# Patient Record
Sex: Female | Born: 1995 | Race: White | Hispanic: No | Marital: Single | Smoking: Never smoker
Health system: Southern US, Community
[De-identification: ages and names within clinical notes are randomized; demographics above are authoritative.]

## PROBLEM LIST (undated history)

## (undated) DIAGNOSIS — F419 Anxiety disorder, unspecified: Secondary | ICD-10-CM

## (undated) HISTORY — PX: NO PAST SURGERIES: SHX2092

---

## 2019-04-25 ENCOUNTER — Other Ambulatory Visit: Payer: Self-pay

## 2019-04-25 DIAGNOSIS — Z20822 Contact with and (suspected) exposure to covid-19: Secondary | ICD-10-CM

## 2019-04-26 LAB — NOVEL CORONAVIRUS, NAA: SARS-CoV-2, NAA: NOT DETECTED

## 2019-11-19 ENCOUNTER — Ambulatory Visit: Payer: Self-pay | Attending: Family

## 2019-11-19 DIAGNOSIS — Z23 Encounter for immunization: Secondary | ICD-10-CM

## 2019-11-19 NOTE — Progress Notes (Signed)
   Covid-19 Vaccination Clinic  Name:  Kathryn Williamson    MRN: [id number] DOB: 1995/11/21  11/19/2019  [name] was observed post Covid-19 immunization for 15 minutes without incident. She was provided with Vaccine Information Sheet and instruction to access the V-Safe system.   [name] was instructed to call 911 with any severe reactions post vaccine: Marland Kitchen Difficulty breathing  . Swelling of face and throat  . A fast heartbeat  . A bad rash all over body  . Dizziness and weakness   Immunizations Administered    Name Date Dose VIS Date Route   Moderna COVID-19 Vaccine 11/19/2019  2:19 PM 0.5 mL 08/04/2019 Intramuscular   Manufacturer: Moderna   Lot: [id number]   NDC: 39584-417-12

## 2019-12-22 ENCOUNTER — Ambulatory Visit: Payer: Self-pay | Attending: Family

## 2019-12-22 DIAGNOSIS — Z23 Encounter for immunization: Secondary | ICD-10-CM

## 2019-12-22 NOTE — Progress Notes (Signed)
   Covid-19 Vaccination Clinic  Name:  Brienna Bass    MRN: [id number] DOB: October 09, 1995  12/22/2019  Ms. Pete was observed post Covid-19 immunization for 15 minutes without incident. She was provided with Vaccine Information Sheet and instruction to access the V-Safe system.   Ms. Shrake was instructed to call 911 with any severe reactions post vaccine: Marland Kitchen Difficulty breathing  . Swelling of face and throat  . A fast heartbeat  . A bad rash all over body  . Dizziness and weakness   Immunizations Administered    Name Date Dose VIS Date Route   Moderna COVID-19 Vaccine 12/22/2019 12:20 PM 0.5 mL 08/2019 Intramuscular   Manufacturer: Moderna   Lot: [id number]   NDC: 03403-524-81

## 2020-07-09 ENCOUNTER — Encounter (HOSPITAL_COMMUNITY): Payer: Self-pay | Admitting: *Deleted

## 2020-07-09 ENCOUNTER — Other Ambulatory Visit: Payer: Self-pay

## 2020-07-09 ENCOUNTER — Ambulatory Visit (HOSPITAL_COMMUNITY)
Admission: EM | Admit: 2020-07-09 | Discharge: 2020-07-09 | Disposition: A | Discharge status: Home / Self Care | Payer: BC Managed Care – PPO | Attending: Family Medicine | Admitting: Family Medicine

## 2020-07-09 DIAGNOSIS — N898 Other specified noninflammatory disorders of vagina: Secondary | ICD-10-CM | POA: Diagnosis not present

## 2020-07-09 DIAGNOSIS — N760 Acute vaginitis: Secondary | ICD-10-CM | POA: Insufficient documentation

## 2020-07-09 LAB — POCT URINALYSIS DIPSTICK, ED / UC
Bilirubin Urine: NEGATIVE
Glucose, UA: NEGATIVE mg/dL
Hgb urine dipstick: NEGATIVE
Ketones, ur: NEGATIVE mg/dL
Leukocytes,Ua: NEGATIVE
Nitrite: NEGATIVE
Protein, ur: NEGATIVE mg/dL
Specific Gravity, Urine: 1.025 (ref 1.005–1.030)
Urobilinogen, UA: 1 mg/dL (ref 0.0–1.0)
pH: 6.5 (ref 5.0–8.0)

## 2020-07-09 MED ORDER — FLUCONAZOLE 150 MG PO TABS: 150.0000 mg | ORAL_TABLET | ORAL | 0 refills | Status: DC

## 2020-07-09 NOTE — ED Triage Notes (Signed)
PT reports frequent yeast infections and reports a white Vag. DC now. Pt has been using OTC meds with out relief. Pt request PO med for yeast be written for 2 doses.

## 2020-07-09 NOTE — ED Provider Notes (Signed)
[facility]    CSN: [id number] Arrival date & time: 07/09/20  1700      History   Chief Complaint Chief Complaint  Patient presents with  . Vaginal Discharge    HPI Kathryn Williamson is a 24 y.o. female.   Patient presenting today with several days of white vaginal discharge and vaginal irritation. Denies dysuria, hematuria, abdominal or pelvic pain, concern for STIs, fever, rashes. Just came off her menstrual cycle several days ago as sxs came on. Trying monistat cream without benefit.      History reviewed. No pertinent past medical history.  There are no problems to display for this patient.   History reviewed. No pertinent surgical history.  OB History   No obstetric history on file.      Home Medications    Prior to Admission medications   Medication Sig Start Date End Date Taking? Authorizing Provider  fluconazole (DIFLUCAN) 150 MG tablet Take 1 tablet (150 mg total) by mouth once a week. 07/09/20   Particia Nearing, PA-C    Family History History reviewed. No pertinent family history.  Social History Social History   Tobacco Use  . Smoking status: Never Smoker  . Smokeless tobacco: Never Used  Substance Use Topics  . Alcohol use: Never  . Drug use: Never     Allergies   Patient has no known allergies.   Review of Systems Review of Systems PER HPI   Physical Exam Triage Vital Signs ED Triage Vitals  Enc Vitals Group     BP 07/09/20 1721 (!) 97/57     Pulse Rate 07/09/20 1721 77     Resp 07/09/20 1721 15     Temp 07/09/20 1721 97.7 F (36.5 C)     Temp Source 07/09/20 1721 Oral     SpO2 07/09/20 1721 99 %     Weight 07/09/20 1724 115 lb (52.2 kg)     Height 07/09/20 1724 5\' 2"  (1.575 m)     Head Circumference --      Peak Flow --      Pain Score 07/09/20 1723 9     Pain Loc --      Pain Edu? --      Excl. in GC? --    No data found.  Updated Vital Signs BP (!) 97/57 (BP Location: Right Arm)   Pulse 77   Temp  97.7 F (36.5 C) (Oral)   Resp 15   Ht 5\' 2"  (1.575 m)   Wt 115 lb (52.2 kg)   LMP 07/06/2020   SpO2 99%   BMI 21.03 kg/m   Visual Acuity Right Eye Distance:   Left Eye Distance:   Bilateral Distance:    Right Eye Near:   Left Eye Near:    Bilateral Near:     Physical Exam Vitals and nursing note reviewed.  Constitutional:      Appearance: Normal appearance. She is not ill-appearing.  HENT:     Head: Atraumatic.  Eyes:     Extraocular Movements: Extraocular movements intact.     Conjunctiva/sclera: Conjunctivae normal.  Cardiovascular:     Rate and Rhythm: Normal rate and regular rhythm.     Heart sounds: Normal heart sounds.  Pulmonary:     Effort: Pulmonary effort is normal.     Breath sounds: Normal breath sounds.  Abdominal:     General: Bowel sounds are normal. There is no distension.     Palpations: Abdomen is soft.  Tenderness: There is no abdominal tenderness. There is no right CVA tenderness, left CVA tenderness or guarding.  Genitourinary:    Comments: GU exam deferred, self swab performed Musculoskeletal:        General: Normal range of motion.     Cervical back: Normal range of motion and neck supple.  Skin:    General: Skin is warm and dry.  Neurological:     Mental Status: She is alert and oriented to person, place, and time.  Psychiatric:        Mood and Affect: Mood normal.        Thought Content: Thought content normal.        Judgment: Judgment normal.    UC Treatments / Results  Labs (all labs ordered are listed, but only abnormal results are displayed) Labs Reviewed  POCT URINALYSIS DIPSTICK, ED / UC  CERVICOVAGINAL ANCILLARY ONLY    EKG   Radiology No results found.  Procedures Procedures (including critical care time)  Medications Ordered in UC Medications - No data to display  Initial Impression / Assessment and Plan / UC Course  I have reviewed the triage vital signs and the nursing notes.  Pertinent labs & imaging  results that were available during my care of the patient were reviewed by me and considered in my medical decision making (see chart for details).     U/A benign, aptima swab pending. Will tx with diflucan while awaiting results given her hx of recurrent yeast infections. Discussed probiotics, good vaginal hygiene. F/u if sxs worsening in meantime.   Final Clinical Impressions(s) / UC Diagnoses   Final diagnoses:  Acute vaginitis   Discharge Instructions   None    ED Prescriptions    Medication Sig Dispense Auth. Provider   fluconazole (DIFLUCAN) 150 MG tablet Take 1 tablet (150 mg total) by mouth once a week. 2 tablet Particia Nearing, [place]     PDMP not reviewed this encounter.   Particia Nearing, [place] 07/09/20 1828

## 2020-07-11 LAB — CERVICOVAGINAL ANCILLARY ONLY
Bacterial Vaginitis (gardnerella): NEGATIVE
Candida Glabrata: NEGATIVE
Candida Vaginitis: NEGATIVE
Comment: NEGATIVE
Comment: NEGATIVE
Comment: NEGATIVE

## 2020-07-25 ENCOUNTER — Encounter (HOSPITAL_COMMUNITY): Payer: Self-pay

## 2020-07-25 ENCOUNTER — Other Ambulatory Visit: Payer: Self-pay

## 2020-07-25 ENCOUNTER — Ambulatory Visit (HOSPITAL_COMMUNITY)
Admission: EM | Admit: 2020-07-25 | Discharge: 2020-07-25 | Disposition: A | Discharge status: Still In Hospital | Payer: BC Managed Care – PPO

## 2020-07-25 ENCOUNTER — Ambulatory Visit (HOSPITAL_COMMUNITY)
Admission: EM | Admit: 2020-07-25 | Discharge: 2020-07-25 | Disposition: A | Discharge status: Home / Self Care | Payer: BC Managed Care – PPO | Attending: Urgent Care | Admitting: Urgent Care

## 2020-07-25 ENCOUNTER — Ambulatory Visit (HOSPITAL_COMMUNITY)
Admission: EM | Admit: 2020-07-25 | Discharge: 2020-07-25 | Disposition: A | Discharge status: Home / Self Care | Payer: BC Managed Care – PPO

## 2020-07-25 DIAGNOSIS — Z3202 Encounter for pregnancy test, result negative: Secondary | ICD-10-CM | POA: Diagnosis not present

## 2020-07-25 DIAGNOSIS — R112 Nausea with vomiting, unspecified: Secondary | ICD-10-CM | POA: Diagnosis not present

## 2020-07-25 DIAGNOSIS — F1290 Cannabis use, unspecified, uncomplicated: Secondary | ICD-10-CM | POA: Insufficient documentation

## 2020-07-25 DIAGNOSIS — F1210 Cannabis abuse, uncomplicated: Secondary | ICD-10-CM | POA: Diagnosis present

## 2020-07-25 LAB — BASIC METABOLIC PANEL
Anion gap: 13 (ref 5–15)
BUN: 10 mg/dL (ref 6–20)
CO2: 21 mmol/L — ABNORMAL LOW (ref 22–32)
Calcium: 9.5 mg/dL (ref 8.9–10.3)
Chloride: 104 mmol/L (ref 98–111)
Creatinine, Ser: 0.77 mg/dL (ref 0.44–1.00)
GFR, Estimated: >60 mL/min (ref >60)
Glucose, Bld: 105 mg/dL — ABNORMAL HIGH (ref 70–99)
Potassium: 3.6 mmol/L (ref 3.5–5.1)
Sodium: 138 mmol/L (ref 135–145)

## 2020-07-25 LAB — CBC
HCT: 38.4 % (ref 36.0–46.0)
Hemoglobin: 13.1 g/dL (ref 12.0–15.0)
MCH: 31.9 pg (ref 26.0–34.0)
MCHC: 34.1 g/dL (ref 30.0–36.0)
MCV: 93.4 fL (ref 80.0–100.0)
Platelets: 221 10*3/uL (ref 150–400)
RBC: 4.11 MIL/uL (ref 3.87–5.11)
RDW: 12.3 % (ref 11.5–15.5)
WBC: 7.6 10*3/uL (ref 4.0–10.5)
nRBC: 0 % (ref 0.0–0.2)

## 2020-07-25 LAB — POC URINE PREG, ED: Preg Test, Ur: NEGATIVE

## 2020-07-25 MED ORDER — ONDANSETRON HCL 4 MG/2ML IJ SOLN
INTRAMUSCULAR | Status: AC
  Filled 2020-07-25: qty 2

## 2020-07-25 MED ORDER — ONDANSETRON 8 MG PO TBDP: 8.0000 mg | ORAL_TABLET | Freq: Three times a day (TID) | ORAL | 0 refills | Status: DC | PRN

## 2020-07-25 MED ORDER — SODIUM CHLORIDE 0.9 % IV BOLUS
1000.0000 mL | Freq: Once | INTRAVENOUS | Status: AC
  Administered 2020-07-25: 1000 mL via INTRAVENOUS

## 2020-07-25 MED ORDER — ONDANSETRON HCL 4 MG/2ML IJ SOLN
4.0000 mg | Freq: Once | INTRAMUSCULAR | Status: AC
  Administered 2020-07-25: 4 mg via INTRAVENOUS

## 2020-07-25 NOTE — ED Notes (Signed)
Spoke to patient about symptoms, vomiting, anxiety.  Spoke to dr Delton See.  Patient sent to ed for further evaluation

## 2020-07-25 NOTE — ED Provider Notes (Signed)
[facility]   MRN: [id number] DOB: October 25, 1995  Subjective:   Kathryn Williamson is a 24 y.o. female presenting for 4-day history of acute onset worsening nausea, vomiting and fatigue.  Patient has started feeling like she is passing out in the past 1 to 2 days.  Had intractable vomiting in the past 2 days.  Admits that she has been smoking marijuana excessively for the past month multiple times per day.  Denies fever, active confusion, headache, chest pain, shortness of breath.  She has had some lower abdominal cramping intermittently the past couple of days.  Pain is moderate and last a few hours.  LMP was 1 week ago.  Denies history of GI issues, diarrhea, constipation, bloody stools.  Denies urinary symptoms.  Denies taking chronic medications.    No Known Allergies  History reviewed. No pertinent past medical history.   History reviewed. No pertinent surgical history.  Family History  Problem Relation Age of Onset  . Healthy Mother   . Healthy Father     Social History   Tobacco Use  . Smoking status: Never Smoker  . Smokeless tobacco: Never Used  Substance Use Topics  . Alcohol use: Never  . Drug use: Never    ROS   Objective:   Vitals: BP (!) 86/63 (BP Location: Left Arm)   Pulse 65   Temp 97.8 F (36.6 C) (Oral)   Resp 19   LMP 07/06/2020   SpO2 99%   Physical Exam Constitutional:      General: She is not in acute distress.    Appearance: Normal appearance. She is well-developed and normal weight. She is not ill-appearing, toxic-appearing or diaphoretic.  HENT:     Head: Normocephalic and atraumatic.     Right Ear: External ear normal.     Left Ear: External ear normal.     Nose: Nose normal.     Mouth/Throat:     Mouth: Mucous membranes are dry.     Pharynx: Oropharynx is clear.  Eyes:     General: No scleral icterus.    Extraocular Movements: Extraocular movements intact.     Pupils: Pupils are equal, round, and reactive to light.    Cardiovascular:     Rate and Rhythm: Normal rate and regular rhythm.     Pulses: Normal pulses.     Heart sounds: Normal heart sounds. No murmur heard.  No friction rub. No gallop.   Pulmonary:     Effort: Pulmonary effort is normal. No respiratory distress.     Breath sounds: Normal breath sounds. No stridor. No wheezing, rhonchi or rales.  Abdominal:     General: Bowel sounds are normal. There is no distension.     Palpations: Abdomen is soft. There is no mass.     Tenderness: There is generalized abdominal tenderness (mild) and tenderness in the right lower quadrant, periumbilical area, suprapubic area and left lower quadrant. There is no right CVA tenderness, left CVA tenderness, guarding or rebound.  Skin:    General: Skin is warm and dry.     Coloration: Skin is not pale.     Findings: No rash.  Neurological:     General: No focal deficit present.     Mental Status: She is alert and oriented to person, place, and time.  Psychiatric:        Mood and Affect: Mood normal.        Behavior: Behavior normal.  Thought Content: Thought content normal.        Judgment: Judgment normal.     Results for orders placed or performed during the hospital encounter of 07/25/20 (from the past 24 hour(s))  CBC     Status: None   Collection Time: 07/25/20  9:00 AM  Result Value Ref Range   WBC 7.6 4.0 - 10.5 K/uL   RBC 4.11 3.87 - 5.11 MIL/uL   Hemoglobin 13.1 12.0 - 15.0 g/dL   HCT 09.3 36 - 46 %   MCV 93.4 80.0 - 100.0 fL   MCH 31.9 26.0 - 34.0 pg   MCHC 34.1 30.0 - 36.0 g/dL   RDW 26.7 12.4 - 58.0 %   Platelets 221 150 - 400 K/uL   nRBC 0.0 0.0 - 0.2 %  Basic metabolic panel     Status: Abnormal   Collection Time: 07/25/20  9:00 AM  Result Value Ref Range   Sodium 138 135 - 145 mmol/L   Potassium 3.6 3.5 - 5.1 mmol/L   Chloride 104 98 - 111 mmol/L   CO2 21 (L) 22 - 32 mmol/L   Glucose, Bld 105 (H) 70 - 99 mg/dL   BUN 10 6 - 20 mg/dL   Creatinine, Ser 9.98 0.44 - 1.00  mg/dL   Calcium 9.5 8.9 - 33.8 mg/dL   GFR, Estimated >25 >05 mL/min   Anion gap 13 5 - 15  POC urine pregnancy     Status: None   Collection Time: 07/25/20  9:18 AM  Result Value Ref Range   Preg Test, Ur NEGATIVE NEGATIVE    Assessment and Plan :   PDMP not reviewed this encounter.  1. Nausea and vomiting, intractability of vomiting not specified, unspecified vomiting type   2. Marijuana abuse   3. Cannabinoid hyperemesis syndrome     1L IV fluids given with IV Zofran. Labs confirm no AKI, severe anemia. Will manage supportively for CHS. Emphasized need to cut back or quit smoking marijuana. Counseled patient on potential for adverse effects with medications prescribed/recommended today, ER and return-to-clinic precautions discussed, patient verbalized understanding.    Wallis Bamberg, PA-C 07/25/20 1026

## 2020-07-25 NOTE — ED Triage Notes (Signed)
Pt in with c/o N/V, fatigue that started 4 days ago. States that she was I the bathroom vomiting and thinks that she passed out.  States that she has been smoking more marijuana than she normally.  States that she also ate an omelet that was kind of raw and accidentally drank milk that was spoiled.  Denies head injury

## 2020-07-25 NOTE — ED Notes (Signed)
Patient is being discharged from the Urgent Care and sent to the Emergency Department via private vehicle . Per dr Delton See, patient is in need of higher level of care due to failed outpatient treatment plan. Patient is aware and verbalizes understanding of plan of care. There were no vitals filed for this visit.

## 2021-07-03 ENCOUNTER — Ambulatory Visit (HOSPITAL_COMMUNITY)
Admission: EM | Admit: 2021-07-03 | Discharge: 2021-07-03 | Disposition: A | Discharge status: Home / Self Care | Payer: BC Managed Care – PPO | Attending: Emergency Medicine | Admitting: Emergency Medicine

## 2021-07-03 ENCOUNTER — Other Ambulatory Visit: Payer: Self-pay

## 2021-07-03 ENCOUNTER — Encounter (HOSPITAL_COMMUNITY): Payer: Self-pay | Admitting: *Deleted

## 2021-07-03 DIAGNOSIS — R112 Nausea with vomiting, unspecified: Secondary | ICD-10-CM

## 2021-07-03 MED ORDER — ONDANSETRON 8 MG PO TBDP: 8.0000 mg | ORAL_TABLET | Freq: Three times a day (TID) | ORAL | 0 refills | Status: AC | PRN

## 2021-07-03 MED ORDER — ONDANSETRON 4 MG PO TBDP
4.0000 mg | ORAL_TABLET | Freq: Once | ORAL | Status: AC
  Administered 2021-07-03: 4 mg via ORAL

## 2021-07-03 MED ORDER — ONDANSETRON HCL 4 MG/2ML IJ SOLN
4.0000 mg | Freq: Once | INTRAMUSCULAR | Status: AC
  Administered 2021-07-03: 4 mg via INTRAMUSCULAR

## 2021-07-03 MED ORDER — ONDANSETRON 4 MG PO TBDP
ORAL_TABLET | ORAL | Status: AC
  Filled 2021-07-03: qty 1

## 2021-07-03 MED ORDER — ONDANSETRON HCL 4 MG/2ML IJ SOLN
INTRAMUSCULAR | Status: AC
  Filled 2021-07-03: qty 2

## 2021-07-03 NOTE — ED Triage Notes (Signed)
Pt reports vomiting and ABD pain at umbilicus since this morning.

## 2021-07-03 NOTE — Discharge Instructions (Addendum)
Symptoms are most likely related to your marijuana use based on your history, if you feel it is related to your anxiety please follow-up with your primary care doctor, please refrain from further marijuana use  Your symptoms today are most likely related to dehydration and you need to rehydrate as much as possible, I have given you oral medicine and an injection to help with this  Please hydrate with water and electrolyte replacement substances such as liquid IV, Gatorade, Pedialyte or similar products  If once home you are still unable to keep fluids down please go to the nearest emergency department for intravenous therapy  May use Zofran underneath the tongue every 8 hours as needed to help prevent vomiting

## 2021-07-04 NOTE — ED Provider Notes (Signed)
[facility]    CSN: [id number] Arrival date & time: 07/03/21  1813      History   Chief Complaint Chief Complaint  Patient presents with   Abdominal Pain   Emesis    HPI Kathryn Williamson is a 25 y.o. female.   Patient presents with persistent vomiting and centralized abdominal pain for 1 day.  Has not been able to tolerate food or liquids.  Endorses lightheadedness and tingling in her extremities.  Has attempted use of promethazine but was unable to keep pill down.  Endorses similar event happened a few years ago after use of marijuana.  Patient endorses that for the last week she has smoked 4-5 blunts a day and stopped 2 days ago when she started to feel unwell.  History of anxiety.  Denies diarrhea or constipation, fever or chills, dietary changes, recent travel, urinary changes or vaginal changes.  History reviewed. No pertinent past medical history.  There are no problems to display for this patient.   History reviewed. No pertinent surgical history.  OB History   No obstetric history on file.      Home Medications    Prior to Admission medications   Medication Sig Start Date End Date Taking? Authorizing Provider  fluconazole (DIFLUCAN) 150 MG tablet Take 1 tablet (150 mg total) by mouth once a week. 07/09/20   Particia Nearing, PA-C  ondansetron (ZOFRAN-ODT) 8 MG disintegrating tablet Take 1 tablet (8 mg total) by mouth every 8 (eight) hours as needed for nausea or vomiting. 07/03/21   Valinda Hoar, NP    Family History Family History  Problem Relation Age of Onset   Healthy Mother    Healthy Father     Social History Social History   Tobacco Use   Smoking status: Never   Smokeless tobacco: Never  Substance Use Topics   Alcohol use: Never   Drug use: Never     Allergies   Patient has no known allergies.   Review of Systems Review of Systems  Constitutional: Negative.   Respiratory: Negative.    Cardiovascular: Negative.    Gastrointestinal:  Positive for abdominal pain and vomiting. Negative for abdominal distention, anal bleeding, blood in stool, constipation, diarrhea, nausea and rectal pain.  Skin: Negative.   Neurological: Negative.     Physical Exam Triage Vital Signs ED Triage Vitals  Enc Vitals Group     BP 07/03/21 1916 123/84     Pulse Rate 07/03/21 1916 (!) 58     Resp 07/03/21 1916 20     Temp 07/03/21 1916 98.2 F (36.8 C)     Temp src --      SpO2 07/03/21 1916 98 %     Weight --      Height --      Head Circumference --      Peak Flow --      Pain Score 07/03/21 1913 9     Pain Loc --      Pain Edu? --      Excl. in GC? --    No data found.  Updated Vital Signs BP 123/84   Pulse (!) 58   Temp 98.2 F (36.8 C)   Resp 20   LMP 06/26/2021   SpO2 98%   Visual Acuity Right Eye Distance:   Left Eye Distance:   Bilateral Distance:    Right Eye Near:   Left Eye Near:    Bilateral Near:     Physical  Exam Constitutional:      Appearance: She is well-developed.  HENT:     Head: Normocephalic.  Eyes:     Extraocular Movements: Extraocular movements intact.  Pulmonary:     Effort: Pulmonary effort is normal.  Abdominal:     General: Abdomen is flat. Bowel sounds are normal.     Palpations: Abdomen is soft.     Tenderness: There is no abdominal tenderness. There is no guarding.  Skin:    General: Skin is warm and dry.  Neurological:     Mental Status: She is alert and oriented to person, place, and time. Mental status is at baseline.  Psychiatric:        Mood and Affect: Mood normal.        Behavior: Behavior normal.     UC Treatments / Results  Labs (all labs ordered are listed, but only abnormal results are displayed) Labs Reviewed - No data to display  EKG   Radiology No results found.  Procedures Procedures (including critical care time)  Medications Ordered in UC Medications  ondansetron (ZOFRAN) injection 4 mg (4 mg Intramuscular Given 07/03/21  1939)  ondansetron (ZOFRAN-ODT) disintegrating tablet 4 mg (4 mg Oral Given 07/03/21 1939)    Initial Impression / Assessment and Plan / UC Course  I have reviewed the triage vital signs and the nursing notes.  Pertinent labs & imaging results that were available during my care of the patient were reviewed by me and considered in my medical decision making (see chart for details).  Intractable vomiting with nausea  Etiology of symptoms most likely due to marijuana usage, discussed cessation with patient, will manage conservatively here in urgent care with goal for patient to able to rehydrate orally, Zofran 4 mg ODT and Zofran 4 mg IM given, after 10 to 15-minute recheck patient feeling much better, able to tolerate cup of water, Zofran 4 mg ODT prescribed, patient encouraged to rehydrate with water and electrolyte replacement substances, given strict return precautions that if vomiting reoccurs and patient is able to tolerate food or liquids to go to the nearest emergency department for evaluation of IV fluid replacement, verbalized understanding  in agreement with plan of care Final Clinical Impressions(s) / UC Diagnoses   Final diagnoses:  Intractable vomiting with nausea     Discharge Instructions      Symptoms are most likely related to your marijuana use based on your history, if you feel it is related to your anxiety please follow-up with your primary care doctor, please refrain from further marijuana use  Your symptoms today are most likely related to dehydration and you need to rehydrate as much as possible, I have given you oral medicine and an injection to help with this  Please hydrate with water and electrolyte replacement substances such as liquid IV, Gatorade, Pedialyte or similar products  If once home you are still unable to keep fluids down please go to the nearest emergency department for intravenous therapy  May use Zofran underneath the tongue every 8 hours as  needed to help prevent vomiting   ED Prescriptions     Medication Sig Dispense Auth. Provider   ondansetron (ZOFRAN-ODT) 8 MG disintegrating tablet Take 1 tablet (8 mg total) by mouth every 8 (eight) hours as needed for nausea or vomiting. 15 tablet Tashi Andujo, Elita Boone, NP      PDMP not reviewed this encounter.   Valinda Hoar, NP 07/04/21 [phone]

## 2021-10-18 ENCOUNTER — Encounter (HOSPITAL_COMMUNITY): Payer: Self-pay | Admitting: Emergency Medicine

## 2021-10-18 ENCOUNTER — Ambulatory Visit (HOSPITAL_COMMUNITY)
Admission: EM | Admit: 2021-10-18 | Discharge: 2021-10-18 | Disposition: A | Discharge status: Home / Self Care | Payer: BC Managed Care – PPO

## 2021-10-18 ENCOUNTER — Other Ambulatory Visit: Payer: Self-pay

## 2021-10-18 DIAGNOSIS — K529 Noninfective gastroenteritis and colitis, unspecified: Secondary | ICD-10-CM | POA: Diagnosis not present

## 2021-10-18 NOTE — Discharge Instructions (Addendum)
Use your nausea medicine. Sip on clear liquids until your diarrhea and vomiting stop and you can tolerate higher volumes of liquids. Use Imodium per package instructions for diarrhea.

## 2021-10-18 NOTE — ED Triage Notes (Signed)
Pt reports emesis beginning yesterday after eating sushi. Also reports fever and chills.

## 2021-10-18 NOTE — ED Provider Notes (Signed)
[facility]    CSN: [id number] Arrival date & time: 10/18/21  0805      History   Chief Complaint Chief Complaint  Patient presents with   Emesis    HPI Delonna Ney is a 26 y.o. female.  She reports vomiting approximately 25 times since after lunch yesterday.  She also reports a similar number of episodes of diarrhea.  She reports liquid stool.  Denies blood in her stool.  Denies fever but did have chills last night.  Reports had abdominal pain initially but has not at this time.  Patient does have her period right now and wonders if the abdominal pain is related to menstrual cramps. Took her own phenergan and zofran leftover from prior illness with no relief. Last vomiting episode 1.5 hours ago.    Emesis Associated symptoms: abdominal pain, chills and diarrhea   Associated symptoms: no fever    History reviewed. No pertinent past medical history.  There are no problems to display for this patient.   History reviewed. No pertinent surgical history.  OB History   No obstetric history on file.      Home Medications    Prior to Admission medications   Medication Sig Start Date End Date Taking? Authorizing Provider  fluconazole (DIFLUCAN) 150 MG tablet Take 1 tablet (150 mg total) by mouth once a week. 07/09/20   Particia Nearing, PA-C  ondansetron (ZOFRAN-ODT) 8 MG disintegrating tablet Take 1 tablet (8 mg total) by mouth every 8 (eight) hours as needed for nausea or vomiting. 07/03/21   Valinda Hoar, NP    Family History Family History  Problem Relation Age of Onset   Healthy Mother    Healthy Father     Social History Social History   Tobacco Use   Smoking status: Never   Smokeless tobacco: Never  Substance Use Topics   Alcohol use: Never   Drug use: Never     Allergies   Patient has no known allergies.   Review of Systems Review of Systems  Constitutional:  Positive for chills. Negative for fever.  Gastrointestinal:   Positive for abdominal pain, diarrhea, nausea and vomiting. Negative for blood in stool.    Physical Exam Triage Vital Signs ED Triage Vitals  Enc Vitals Group     BP 10/18/21 0830 95/60     Pulse Rate 10/18/21 0830 64     Resp 10/18/21 0830 16     Temp 10/18/21 0830 98.3 F (36.8 C)     Temp Source 10/18/21 0830 Oral     SpO2 10/18/21 0830 99 %     Weight 10/18/21 0829 115 lb 1.3 oz (52.2 kg)     Height 10/18/21 0829 5\' 2"  (1.575 m)     Head Circumference --      Peak Flow --      Pain Score 10/18/21 0828 6     Pain Loc --      Pain Edu? --      Excl. in GC? --    No data found.  Updated Vital Signs BP 95/60 (BP Location: Right Arm)    Pulse 64    Temp 98.3 F (36.8 C) (Oral)    Resp 16    Ht 5\' 2"  (1.575 m)    Wt 115 lb 1.3 oz (52.2 kg)    LMP 10/17/2021 (Exact Date)    SpO2 99%    BMI 21.05 kg/m   Visual Acuity Right Eye Distance:   Left  Eye Distance:   Bilateral Distance:    Right Eye Near:   Left Eye Near:    Bilateral Near:     Physical Exam Constitutional:      General: She is not in acute distress.    Appearance: Normal appearance. She is not ill-appearing.  Cardiovascular:     Rate and Rhythm: Normal rate and regular rhythm.  Pulmonary:     Effort: Pulmonary effort is normal.     Breath sounds: Normal breath sounds.  Abdominal:     General: Abdomen is flat. Bowel sounds are increased. There is no distension.     Palpations: Abdomen is soft.     Tenderness: There is no abdominal tenderness. There is no guarding or rebound.  Neurological:     Mental Status: She is alert.     UC Treatments / Results  Labs (all labs ordered are listed, but only abnormal results are displayed) Labs Reviewed - No data to display  EKG   Radiology No results found.  Procedures Procedures (including critical care time)  Medications Ordered in UC Medications - No data to display  Initial Impression / Assessment and Plan / UC Course  I have reviewed the triage  vital signs and the nursing notes.  Pertinent labs & imaging results that were available during my care of the patient were reviewed by me and considered in my medical decision making (see chart for details).    Discussed expected course of illness.  Patient to continue using her Phenergan or Zofran to relieve nausea.  We discussed hydration strategy.  Final Clinical Impressions(s) / UC Diagnoses   Final diagnoses:  Gastroenteritis     Discharge Instructions      Use your nausea medicine. Sip on clear liquids until your diarrhea and vomiting stop and you can tolerate higher volumes of liquids. Use Imodium per package instructions for diarrhea.    ED Prescriptions   None    PDMP not reviewed this encounter.   Cathlyn Parsons, NP 10/18/21 [phone]

## 2022-01-17 ENCOUNTER — Inpatient Hospital Stay (HOSPITAL_COMMUNITY)
Admission: AD | Admit: 2022-01-17 | Discharge: 2022-01-17 | Disposition: A | Discharge status: Home / Self Care | Payer: BC Managed Care – PPO | Attending: Obstetrics & Gynecology | Admitting: Obstetrics & Gynecology

## 2022-01-17 ENCOUNTER — Encounter (HOSPITAL_COMMUNITY): Payer: Self-pay | Admitting: Obstetrics & Gynecology

## 2022-01-17 ENCOUNTER — Ambulatory Visit (HOSPITAL_COMMUNITY)
Admission: EM | Admit: 2022-01-17 | Discharge: 2022-01-17 | Disposition: A | Discharge status: Home / Self Care | Payer: BC Managed Care – PPO

## 2022-01-17 ENCOUNTER — Encounter (HOSPITAL_COMMUNITY): Payer: Self-pay

## 2022-01-17 ENCOUNTER — Inpatient Hospital Stay (HOSPITAL_COMMUNITY): Payer: BC Managed Care – PPO

## 2022-01-17 DIAGNOSIS — O209 Hemorrhage in early pregnancy, unspecified: Secondary | ICD-10-CM | POA: Diagnosis not present

## 2022-01-17 DIAGNOSIS — Z6791 Unspecified blood type, Rh negative: Secondary | ICD-10-CM

## 2022-01-17 DIAGNOSIS — Z332 Encounter for elective termination of pregnancy: Secondary | ICD-10-CM | POA: Diagnosis not present

## 2022-01-17 DIAGNOSIS — O26891 Other specified pregnancy related conditions, first trimester: Secondary | ICD-10-CM | POA: Diagnosis not present

## 2022-01-17 DIAGNOSIS — R109 Unspecified abdominal pain: Secondary | ICD-10-CM

## 2022-01-17 DIAGNOSIS — R112 Nausea with vomiting, unspecified: Secondary | ICD-10-CM | POA: Diagnosis not present

## 2022-01-17 DIAGNOSIS — Z3A08 8 weeks gestation of pregnancy: Secondary | ICD-10-CM | POA: Insufficient documentation

## 2022-01-17 DIAGNOSIS — R6883 Chills (without fever): Secondary | ICD-10-CM | POA: Diagnosis not present

## 2022-01-17 DIAGNOSIS — R1030 Lower abdominal pain, unspecified: Secondary | ICD-10-CM | POA: Diagnosis not present

## 2022-01-17 DIAGNOSIS — O0489 (Induced) termination of pregnancy with other complications: Secondary | ICD-10-CM

## 2022-01-17 HISTORY — DX: Anxiety disorder, unspecified: F41.9

## 2022-01-17 LAB — CBC
HCT: 35.5 % — ABNORMAL LOW (ref 36.0–46.0)
Hemoglobin: 12.6 g/dL (ref 12.0–15.0)
MCH: 33.3 pg (ref 26.0–34.0)
MCHC: 35.5 g/dL (ref 30.0–36.0)
MCV: 93.9 fL (ref 80.0–100.0)
Platelets: 245 10*3/uL (ref 150–400)
RBC: 3.78 MIL/uL — ABNORMAL LOW (ref 3.87–5.11)
RDW: 12.5 % (ref 11.5–15.5)
WBC: 20.1 10*3/uL — ABNORMAL HIGH (ref 4.0–10.5)
nRBC: 0 % (ref 0.0–0.2)

## 2022-01-17 LAB — COMPREHENSIVE METABOLIC PANEL
ALT: 20 U/L (ref 0–44)
AST: 31 U/L (ref 15–41)
Albumin: 4.3 g/dL (ref 3.5–5.0)
Alkaline Phosphatase: 43 U/L (ref 38–126)
Anion gap: 12 (ref 5–15)
BUN: 12 mg/dL (ref 6–20)
CO2: 16 mmol/L — ABNORMAL LOW (ref 22–32)
Calcium: 9.1 mg/dL (ref 8.9–10.3)
Chloride: 106 mmol/L (ref 98–111)
Creatinine, Ser: 0.67 mg/dL (ref 0.44–1.00)
GFR, Estimated: >60 mL/min (ref >60)
Glucose, Bld: 151 mg/dL — ABNORMAL HIGH (ref 70–99)
Potassium: 3.9 mmol/L (ref 3.5–5.1)
Sodium: 134 mmol/L — ABNORMAL LOW (ref 135–145)
Total Bilirubin: 1.2 mg/dL (ref 0.3–1.2)
Total Protein: 7.3 g/dL (ref 6.5–8.1)

## 2022-01-17 LAB — WET PREP, GENITAL
Clue Cells Wet Prep HPF POC: NONE SEEN
Sperm: NONE SEEN
Trich, Wet Prep: NONE SEEN
WBC, Wet Prep HPF POC: >=10 — AB (ref <10)
Yeast Wet Prep HPF POC: NONE SEEN

## 2022-01-17 LAB — TYPE AND SCREEN
ABO/RH(D): O NEG
Antibody Screen: NEGATIVE

## 2022-01-17 LAB — HCG, QUANTITATIVE, PREGNANCY: hCG, Beta Chain, Quant, S: 70668 m[IU]/mL — ABNORMAL HIGH (ref <5)

## 2022-01-17 MED ORDER — KETOROLAC TROMETHAMINE 10 MG PO TABS: 10.0000 mg | ORAL_TABLET | Freq: Four times a day (QID) | ORAL | 0 refills | Status: AC | PRN

## 2022-01-17 MED ORDER — RHO D IMMUNE GLOBULIN 1500 UNIT/2ML IJ SOSY
300.0000 ug | PREFILLED_SYRINGE | Freq: Once | INTRAMUSCULAR | Status: AC
  Administered 2022-01-17: 300 ug via INTRAVENOUS
  Filled 2022-01-17: qty 2

## 2022-01-17 MED ORDER — OXYCODONE HCL 5 MG PO TABS: 5.0000 mg | ORAL_TABLET | ORAL | 0 refills | Status: AC | PRN

## 2022-01-17 MED ORDER — LACTATED RINGERS IV BOLUS
1000.0000 mL | Freq: Once | INTRAVENOUS | Status: AC
  Administered 2022-01-17: 1000 mL via INTRAVENOUS

## 2022-01-17 MED ORDER — HYDROMORPHONE HCL 1 MG/ML IJ SOLN
1.0000 mg | INTRAMUSCULAR | Status: DC | PRN
  Administered 2022-01-17: 1 mg via INTRAVENOUS
  Filled 2022-01-17: qty 1

## 2022-01-17 MED ORDER — ONDANSETRON HCL 4 MG/2ML IJ SOLN
4.0000 mg | Freq: Once | INTRAMUSCULAR | Status: AC
  Administered 2022-01-17: 4 mg via INTRAVENOUS
  Filled 2022-01-17: qty 2

## 2022-01-17 MED ORDER — MISOPROSTOL 200 MCG PO TABS: 200.0000 ug | ORAL_TABLET | Freq: Three times a day (TID) | ORAL | 0 refills | Status: AC

## 2022-01-17 NOTE — ED Provider Notes (Addendum)
?[facility] ? ? ? ?CSN: [id number] ?Arrival date & time: 01/17/22  0803 ? ? ?  ? ?History   ?Chief Complaint ?Chief Complaint  ?Patient presents with  ? Emesis  ? ? ?HPI ?Kathryn Williamson is a 26 y.o. female.  ? ?Patient is a 26 year old female presenting today with nausea, vomiting, abdominal pain, and diaphoresis related to medical abortion.  She went to a women's health clinic to seek care for abortion services and was prescribed misoprostol, phenergan, Zofran, ibuprofen, and tylenol. She took misoprostol as prescribed last night at around 10 pm and states she has been up all night vomiting.  She states that she has had to change her peri-pad 4 times an hour since she took the medication last night to induce abortion. She has not been able to keep any water or food down since yesterday without vomiting almost immediately. She denies fever, but reports chills. Her abdominal pain is severe to her right and left lower quadrants and started yesterday when she took the misoprostol. Phenergan and zofran do not help with nausea/vomiting. She has tried taking ibuprofen/tylenol, but is unable to keep it down. Her father drove her to urgent care this morning to be evaluated. She denies lightheadedness/dizziness at this time.  ? ? ?Emesis ? ?History reviewed. No pertinent past medical history. ? ?There are no problems to display for this patient. ? ? ?History reviewed. No pertinent surgical history. ? ?OB History   ?No obstetric history on file. ?  ? ? ? ?Home Medications   ? ?Prior to Admission medications   ?Medication Sig Start Date End Date Taking? Authorizing Provider  ?ondansetron (ZOFRAN-ODT) 8 MG disintegrating tablet Take 1 tablet (8 mg total) by mouth every 8 (eight) hours as needed for nausea or vomiting. 07/03/21   Valinda Hoar, NP  ? ? ?Family History ?Family History  ?Problem Relation Age of Onset  ? Healthy Mother   ? Healthy Father   ? ? ?Social History ?Social History  ? ?Tobacco Use  ? Smoking  status: Never  ? Smokeless tobacco: Never  ?Substance Use Topics  ? Alcohol use: Never  ? Drug use: Never  ? ? ? ?Allergies   ?Patient has no known allergies. ? ? ?Review of Systems ?Review of Systems  ?Gastrointestinal:  Positive for vomiting.  ?Per HPI ? ?Physical Exam ?Triage Vital Signs ?ED Triage Vitals  ?Enc Vitals Group  ?   BP --   ?   Pulse Rate 01/17/22 0820 73  ?   Resp 01/17/22 0820 18  ?   Temp 01/17/22 0820 97.6 ?F (36.4 ?C)  ?   Temp Source 01/17/22 0820 Oral  ?   SpO2 01/17/22 0820 100 %  ?   Weight --   ?   Height --   ?   Head Circumference --   ?   Peak Flow --   ?   Pain Score 01/17/22 0821 10  ?   Pain Loc --   ?   Pain Edu? --   ?   Excl. in GC? --   ? ?No data found. ? ?Updated Vital Signs ?BP 127/81 (BP Location: Left Arm)   Pulse 73   Temp 97.6 ?F (36.4 ?C) (Oral)   Resp 18   SpO2 100%  ? ?Visual Acuity ?Right Eye Distance:   ?Left Eye Distance:   ?Bilateral Distance:   ? ?Right Eye Near:   ?Left Eye Near:    ?Bilateral Near:    ? ?  Physical Exam ?Vitals and nursing note reviewed.  ?Constitutional:   ?   General: She is not in acute distress. ?   Appearance: Normal appearance. She is well-developed. She is ill-appearing and diaphoretic.  ?HENT:  ?   Head: Normocephalic and atraumatic.  ?   Right Ear: External ear normal.  ?   Left Ear: External ear normal.  ?   Nose: Nose normal.  ?   Mouth/Throat:  ?   Mouth: Mucous membranes are moist.  ?Eyes:  ?   General: Lids are normal. Vision grossly intact. Gaze aligned appropriately. No visual field deficit. ?   Extraocular Movements: Extraocular movements intact.  ?   Conjunctiva/sclera: Conjunctivae normal.  ?Cardiovascular:  ?   Rate and Rhythm: Normal rate and regular rhythm.  ?   Pulses: Normal pulses.  ?   Heart sounds: Normal heart sounds, S1 normal and S2 normal. No murmur heard. ?  No friction rub. No gallop.  ?Pulmonary:  ?   Effort: Pulmonary effort is normal. No respiratory distress.  ?   Breath sounds: Normal breath sounds. No  wheezing, rhonchi or rales.  ?   Comments: Breath sounds equal and clear bilaterally to all lung fields. ?Chest:  ?   Chest wall: No tenderness.  ?Abdominal:  ?   General: Abdomen is flat. Bowel sounds are normal. There is no distension.  ?   Palpations: Abdomen is soft.  ?   Tenderness: There is abdominal tenderness in the right lower quadrant, suprapubic area and left lower quadrant. There is no right CVA tenderness, left CVA tenderness or guarding. Negative signs include McBurney's sign.  ?Musculoskeletal:     ?   General: No swelling.  ?   Cervical back: Normal range of motion and neck supple.  ?   Right lower leg: No edema.  ?   Left lower leg: No edema.  ?Skin: ?   General: Skin is warm.  ?   Capillary Refill: Capillary refill takes less than 2 seconds.  ?   Coloration: Skin is pale.  ?   Findings: No rash.  ?Neurological:  ?   General: No focal deficit present.  ?   Mental Status: She is alert and oriented to person, place, and time. Mental status is at baseline.  ?   Cranial Nerves: Cranial nerves 2-12 are intact.  ?   Sensory: Sensation is intact.  ?   Motor: Motor function is intact.  ?   Coordination: Coordination is intact.  ?Psychiatric:     ?   Attention and Perception: Attention normal.     ?   Mood and Affect: Affect normal. Mood is anxious.     ?   Speech: Speech normal.     ?   Behavior: Behavior normal.     ?   Thought Content: Thought content normal.     ?   Judgment: Judgment normal.  ?   Comments: Patient tearful at time of exam.  ? ? ? ?UC Treatments / Results  ?Labs ?(all labs ordered are listed, but only abnormal results are displayed) ?Labs Reviewed - No data to display ? ?EKG ? ? ?Radiology ?No results found. ? ?Procedures ?Procedures (including critical care time) ? ?Medications Ordered in UC ?Medications - No data to display ? ?Initial Impression / Assessment and Plan / UC Course  ?I have reviewed the triage vital signs and the nursing notes. ? ?Pertinent labs & imaging results that were  available during my care of the  patient were reviewed by me and considered in my medical decision making (see chart for details). ? ?Patient is a 26 year old female presenting to urgent care today with complications related to medical abortion.  She is very pale to physical exam, diaphoretic, and tender to palpation of her abdomen.  Subjective history of significant vaginal bleeding with changing peri-pad 4 times every hour very concerning for significant blood loss related to medical abortion.  Recommend immediate evaluation in the MAU at [facility] for urgent labs and fluids.  Recommended EMS transport to hospital. Patient declined due to cost. Patient's vital signs are stable at this time and her father is with her for support. Father to drive patient to MAU for further evaluation.  ? ?Final Clinical Impressions(s) / UC Diagnoses  ? ?Final diagnoses:  ?Nausea and vomiting, unspecified vomiting type  ?Abdominal pain, unspecified abdominal location  ? ? ? ?Discharge Instructions   ? ?  ?Please go to MAU immediately for further evaluation.  ? ? ? ? ?ED Prescriptions   ?None ?  ? ?PDMP not reviewed this encounter. ?  ?Carlisle Beers, FNP ?01/17/22 0900 ? ?  ?Carlisle Beers, FNP ?01/17/22 0901 ? ?

## 2022-01-17 NOTE — Discharge Instructions (Addendum)
Please go to MAU immediately for further evaluation.  ?

## 2022-01-17 NOTE — ED Triage Notes (Signed)
Pt states took pills around 10pm last night for an abortion. States has been vomiting and having sever pain ever since. States has went through 10 pads and still bleeding heavy. States took zofran and phenergan with no relief. Unable to keep anything down. Pt is pale is appearance.  ?

## 2022-01-17 NOTE — MAU Provider Note (Addendum)
History     CSN: [id number]  Arrival date and time: 01/17/22 0850   Event Date/Time   First Provider Initiated Contact with Patient 01/17/22 0914     Chief Complaint  Patient presents with   Nausea   Abdominal Pain   Emesis   Vaginal Bleeding   26 year old G1, P0 at approximately 8 weeks presenting from urgent care due to heavy vaginal bleeding after medical termination.  Patient reports being given 1 medication at the clinic yesterday morning and then taking 4 tablets of Cytotec vaginally last night.  Bleeding started about 1 hour later.  Reports saturating 4 pads in an hour and passing clots.  Endorses nausea, vomiting, chills.  Endorses lower abdominal pain.  Rates pain 10/10.  Has used ibuprofen and Tylenol without any relief.   OB History     Gravida  1   Para      Term      Preterm      AB      Living         SAB      IAB      Ectopic      Multiple      Live Births              Past Medical History:  Diagnosis Date   Anxiety     Past Surgical History:  Procedure Laterality Date   NO PAST SURGERIES      Family History  Problem Relation Age of Onset   Healthy Mother    Healthy Father     Social History   Tobacco Use   Smoking status: Never   Smokeless tobacco: Never  Vaping Use   Vaping Use: Never used  Substance Use Topics   Alcohol use: Never   Drug use: Not Currently    Types: Marijuana    Allergies: No Known Allergies  No medications prior to admission.    Review of Systems  Constitutional:  Positive for chills. Negative for fever.  Gastrointestinal:  Positive for abdominal pain, nausea and vomiting.  Genitourinary:  Positive for vaginal bleeding.  Physical Exam   Blood pressure (!) 96/53, pulse 84, temperature 97.8 F (36.6 C), temperature source Oral, resp. rate 17, last menstrual period 10/17/2021, SpO2 100 %. Orthostatic VS for the past 24 hrs:  BP- Lying Pulse- Lying BP- Sitting Pulse- Sitting BP- Standing at 0  minutes Pulse- Standing at 0 minutes  01/17/22 0915 107/67 70 104/66 74 112/82 70   Physical Exam Vitals and nursing note reviewed. Exam conducted with a chaperone present.  Constitutional:      General: She is not in acute distress.    Appearance: Normal appearance. She is ill-appearing.  HENT:     Head: Normocephalic and atraumatic.  Cardiovascular:     Rate and Rhythm: Normal rate.  Pulmonary:     Effort: Pulmonary effort is normal. No respiratory distress.  Abdominal:     General: There is no distension.     Palpations: Abdomen is soft. There is no mass.     Tenderness: There is no abdominal tenderness. There is no guarding or rebound.     Hernia: No hernia is present.  Genitourinary:    Comments: VE: POCs protruding from os, removed with ring forcep, bleeding minimal, cervix visually closed Musculoskeletal:        General: Normal range of motion.     Cervical back: Normal range of motion.  Skin:    Coloration: Skin is pale.  Neurological:     General: No focal deficit present.     Mental Status: She is alert and oriented to person, place, and time.  Psychiatric:        Mood and Affect: Mood normal.        Behavior: Behavior normal.   Results for orders placed or performed during the hospital encounter of 01/17/22 (from the past 24 hour(s))  Type and screen     Status: None   Collection Time: 01/17/22  9:10 AM  Result Value Ref Range   ABO/RH(D) O NEG    Antibody Screen NEG    Sample Expiration      01/20/2022,2359 Performed at [facility], [address] [place], [place] [address]   CBC     Status: Abnormal   Collection Time: 01/17/22  9:14 AM  Result Value Ref Range   WBC 20.1 (H) 4.0 - 10.5 K/uL   RBC 3.78 (L) 3.87 - 5.11 MIL/uL   Hemoglobin 12.6 12.0 - 15.0 g/dL   HCT 60.435.5 (L) 54.036.0 - 98.146.0 %   MCV 93.9 80.0 - 100.0 fL   MCH 33.3 26.0 - 34.0 pg   MCHC 35.5 30.0 - 36.0 g/dL   RDW 19.112.5 47.811.5 - 29.515.5 %   Platelets 245 150 - 400 K/uL   nRBC 0.0 0.0 - 0.2  %  Comprehensive metabolic panel     Status: Abnormal   Collection Time: 01/17/22  9:14 AM  Result Value Ref Range   Sodium 134 (L) 135 - 145 mmol/L   Potassium 3.9 3.5 - 5.1 mmol/L   Chloride 106 98 - 111 mmol/L   CO2 16 (L) 22 - 32 mmol/L   Glucose, Bld 151 (H) 70 - 99 mg/dL   BUN 12 6 - 20 mg/dL   Creatinine, Ser 6.210.67 0.44 - 1.00 mg/dL   Calcium 9.1 8.9 - 30.810.3 mg/dL   Total Protein 7.3 6.5 - 8.1 g/dL   Albumin 4.3 3.5 - 5.0 g/dL   AST 31 15 - 41 U/L   ALT 20 0 - 44 U/L   Alkaline Phosphatase 43 38 - 126 U/L   Total Bilirubin 1.2 0.3 - 1.2 mg/dL   GFR, Estimated >65>60 >78>60 mL/min   Anion gap 12 5 - 15  hCG, quantitative, pregnancy     Status: Abnormal   Collection Time: 01/17/22  9:14 AM  Result Value Ref Range   hCG, Beta Chain, Quant, S 70,668 (H) <5 mIU/mL  Wet prep, genital     Status: Abnormal   Collection Time: 01/17/22  9:28 AM  Result Value Ref Range   Yeast Wet Prep HPF POC NONE SEEN NONE SEEN   Trich, Wet Prep NONE SEEN NONE SEEN   Clue Cells Wet Prep HPF POC NONE SEEN NONE SEEN   WBC, Wet Prep HPF POC >=10 (A) <10   Sperm NONE SEEN   Rh IG workup (includes ABO/Rh)     Status: None (Preliminary result)   Collection Time: 01/17/22 10:55 AM  Result Value Ref Range   Gestational Age(Wks) 8    Unit Number [id number]    Blood Component Type RHIG    Unit division 00    Status of Unit ISSUED    Transfusion Status      OK TO TRANSFUSE Performed at [facility], [address] [place], [place] [address]    US OB LESS THAN 14 WEEKS WITH [place] TRANSVAGINAL  Result Date: 01/17/2022 CLINICAL DATA:  Vaginal bleeding.  Took abortion medication yesterday. EXAM: OBSTETRIC <14 WK [place] AND TRANSVAGINAL OB US TECHNIQUE: Both transabdominal and transvaginal ultrasound examinations were performed for complete evaluation of the gestation as well as the maternal uterus, adnexal regions, and pelvic cul-de-sac. Transvaginal technique was performed to assess early pregnancy.  COMPARISON:  None Available. FINDINGS: Intrauterine gestational sac: None. Maternal uterus/adnexae: Heterogeneous and thickened endometrium with increased vascularity. The ovaries are unremarkable. Trace free fluid in the pelvis, likely physiologic. IMPRESSION: 1. No intrauterine pregnancy. Heterogeneous and thickened endometrium consistent with ongoing abortion. Electronically Signed   By: Obie Dredge M.D.   On: 01/17/2022 10:14    MAU Course  Procedures LR Dilaudid Zofran Rhogam  MDM Labs and [place] ordered and reviewed. Pain and nausea improved. Consult with Dr. Despina Hidden, images reviewed, no signs of retained POCs, will Rx Cytotec TID x3 d. I confirmed with her and she did not receive Rhogam at the termination center. Rhogam given here. She has financial concerns regarding this visit therefore tissue was not sent to path. She is stable for discharge home.   Assessment and Plan   1. (induced) termination of pregnancy with other complications   2. Rh negative status during pregnancy in first trimester    Discharge home Follow up at [facility] in 2 weeks-message sent (pt returning to [place] 01/31/22) Bleeding return precautions Rx Cytotec Rx Toradol Rx Oxycodone Continue Zofran and Phenergan  prn (has Rx)  Allergies as of 01/17/2022   No Known Allergies      Medication List     TAKE these medications    ketorolac 10 MG tablet Commonly known as: TORADOL Take 1 tablet (10 mg total) by mouth every 6 (six) hours as needed for moderate pain.   misoprostol 200 MCG tablet Commonly known as: CYTOTEC Take 1 tablet (200 mcg total) by mouth 3 (three) times daily. What changed: when to take this   omeprazole 20 MG capsule Commonly known as: PRILOSEC Take 20 mg by mouth daily.   ondansetron 8 MG disintegrating tablet Commonly known as: ZOFRAN-ODT Take 1 tablet (8 mg total) by mouth every 8 (eight) hours as needed for nausea or vomiting.   oxyCODONE 5 MG immediate release tablet Commonly  known as: Roxicodone Take 1 tablet (5 mg total) by mouth every 4 (four) hours as needed for severe pain or breakthrough pain.   promethazine 25 MG tablet Commonly known as: PHENERGAN Take 25 mg by mouth every 6 (six) hours as needed for nausea or vomiting.       Donette Larry, CNM 01/17/2022, 12:35 PM

## 2022-01-17 NOTE — MAU Note (Signed)
...  Kathryn Williamson is a 26 y.o. at Unknown here in MAU reporting: Patient reports she was approximately [redacted] weeks pregnant and went to [facility] yesterday and received abortion pills. She states she took the PO medication at 1000 yesterday and the vaginal suppositories last night around 2200. She reports around 2300 last night she began bleeding and has been soaking 3-4 maxi pads each hour since 2300. She repotrs since then she has also passed arfound 5 finger sized blood clots. She rates her lower abdominal pain a 10/10. ? ?Last took Ibuprofen around 0400 as well as Tylenol around 0600 but reports she has vomited each hour and has not kept anything down.  ? ?LMP: 2nd or 3rd week of March. ? ?Pain score: 10/10 lower abdomen ? ? ?

## 2022-01-17 NOTE — ED Notes (Signed)
Patient is being discharged from the Urgent Care and sent to the Emergency Department (MAU) via private vehicle with father driving.Per provider patient is in need of higher level of care due to need for further eval/tx. Patient is aware and verbalizes understanding of plan of care.  ?Vitals:  ? 01/17/22 0820 01/17/22 0823  ?BP:  127/81  ?Pulse: 73   ?Resp: 18   ?Temp: 97.6 ?F (36.4 ?C)   ?SpO2: 100%   ? ? ?

## 2022-01-18 LAB — RH IG WORKUP (INCLUDES ABO/RH)
Gestational Age(Wks): 8
Unit division: 0

## 2022-01-18 LAB — GC/CHLAMYDIA PROBE AMP (~~LOC~~) NOT AT ARMC
Chlamydia: NEGATIVE
Comment: NEGATIVE
Comment: NORMAL
Neisseria Gonorrhea: NEGATIVE

## 2022-01-26 ENCOUNTER — Encounter: Payer: Self-pay | Admitting: Certified Nurse Midwife

## 2022-01-26 ENCOUNTER — Ambulatory Visit: Payer: BC Managed Care – PPO | Admitting: Certified Nurse Midwife

## 2022-01-26 VITALS — BP 104/61 | HR 81 | Wt 122.2 lb

## 2022-01-26 DIAGNOSIS — Z9889 Other specified postprocedural states: Secondary | ICD-10-CM

## 2022-01-26 NOTE — Progress Notes (Signed)
   Subjective:   Patient Name: Kathryn Williamson, female   DOB: 11-Apr-1996, 26 y.o.  MRN: [id number]  HPI Seen for follow up of IAB 9 days ago. Denies bleeding, cramping. Menses has not returned yet. Does not plan on becoming pregnant soon.  Wants to wait for birth control until she has had a few normal cycles. Will use a barrier method for now.  Review of Systems Pertinent items noted in HPI and remainder of comprehensive ROS otherwise negative.     Objective:   Physical Exam  Constitutional: She is oriented to person, place, and time. She appears well-developed and well-nourished.  Cardiovascular: Normal rate.  Abdominal: Soft. She exhibits no distension.  Neurological: She is alert and oriented to person, place, and time.  Skin: Skin is warm and dry.  Psychiatric: She has a normal mood and affect. Her behavior is normal. Judgment and thought content normal.     Assessment & Plan:  1. History of elective abortion - bleeding down to spotting, cramping is minimal - Patient counseled regarding future pregnancy plans and timing  - Contraception: barrier method, is considering IUD  Bernerd Limbo MSN, CNM 01/26/22  12:30 PM

## 2022-11-19 IMAGING — US US OB < 14 WEEKS - US OB TV
1 series · 15 of 28 positions shown · non-contrast
Comparison: None Available.

CLINICAL DATA: Vaginal bleeding. Took abortion medication
yesterday.

EXAM:
OBSTETRIC <14 WK US AND TRANSVAGINAL OB US
TECHNIQUE: Both transabdominal and transvaginal ultrasound examinations were
performed for complete evaluation of the gestation as well as the
maternal uterus, adnexal regions, and pelvic cul-de-sac.
Transvaginal technique was performed to assess early pregnancy.

[Series 1: us ob < 14 weeks - us ob tv · 15 of 80 slices shown]
[im 1/80]
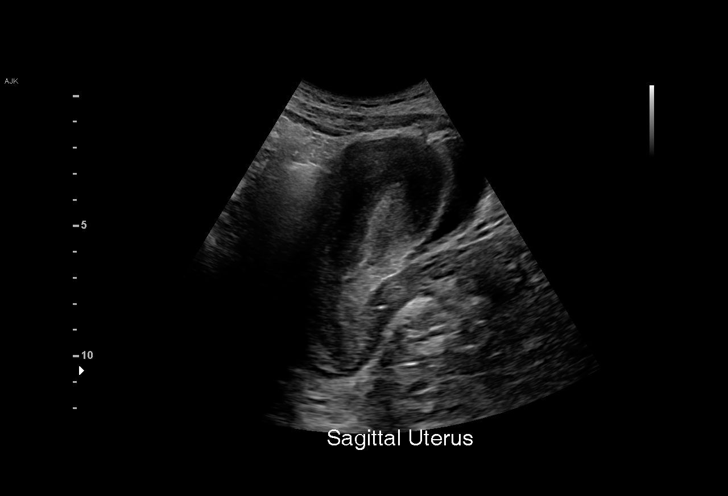
[im 6/80]
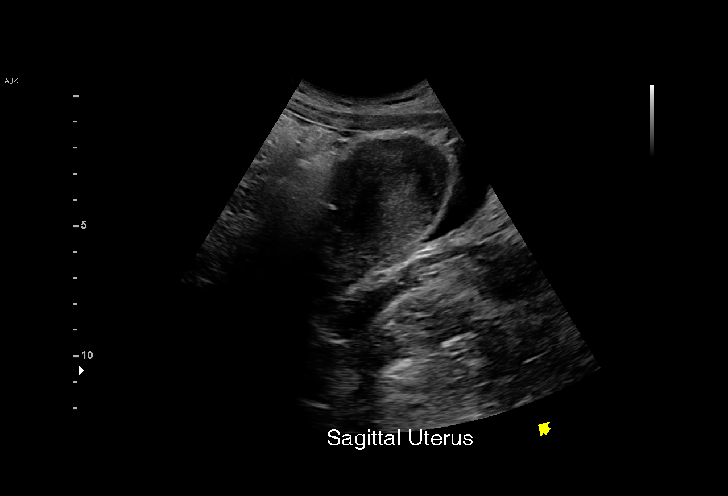
[im 12/80]
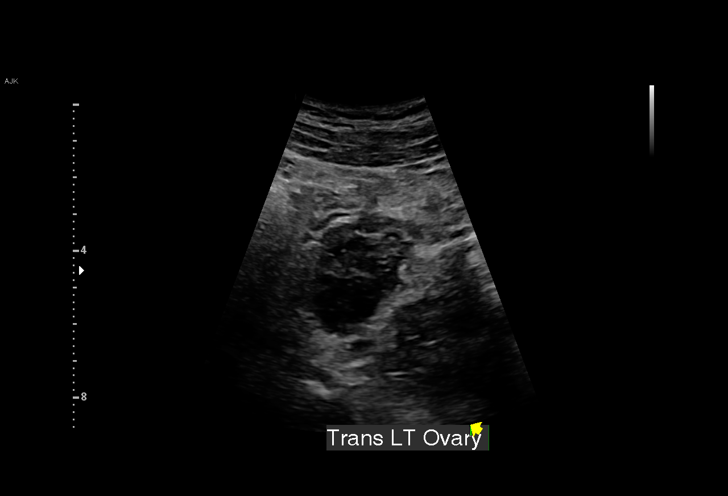
[im 18/80]
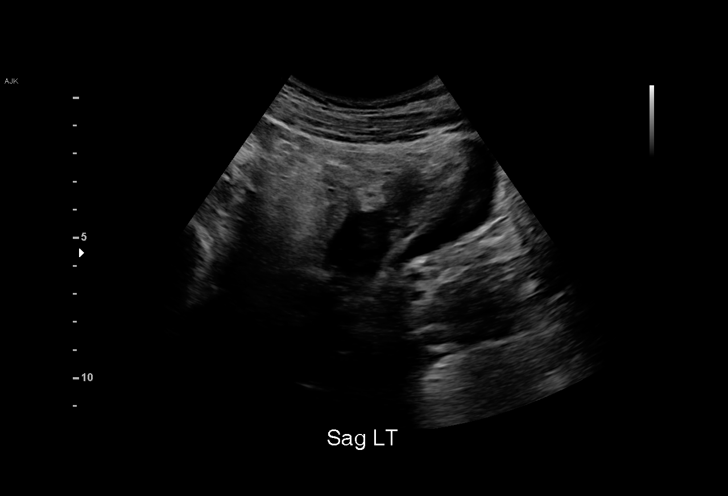
[im 24/80]
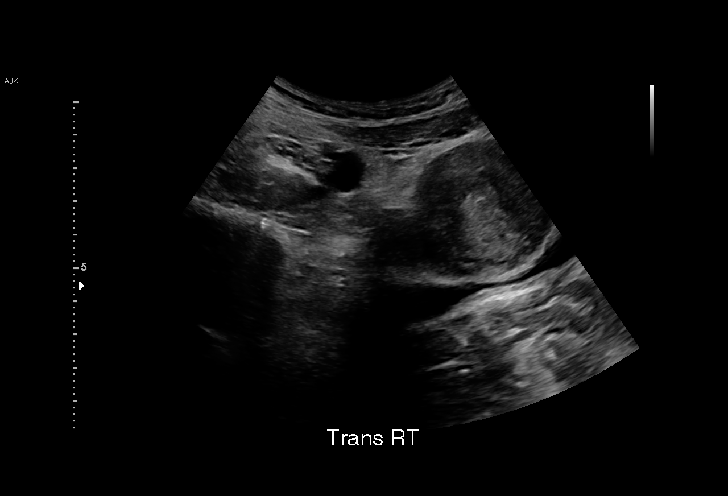
[im 30/80]
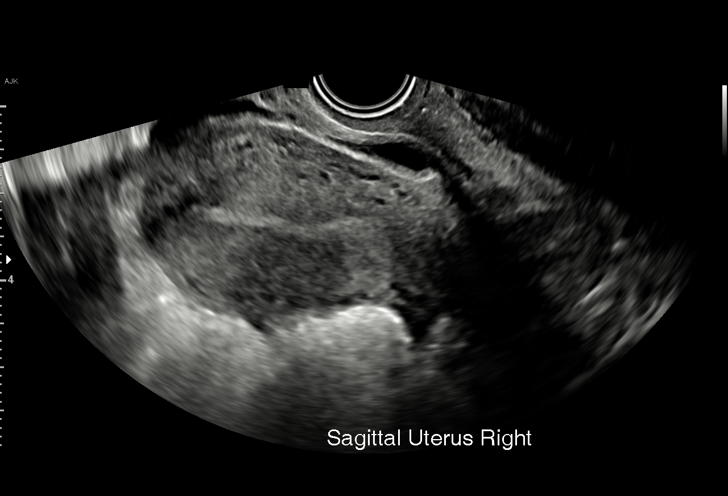
[im 36/80]
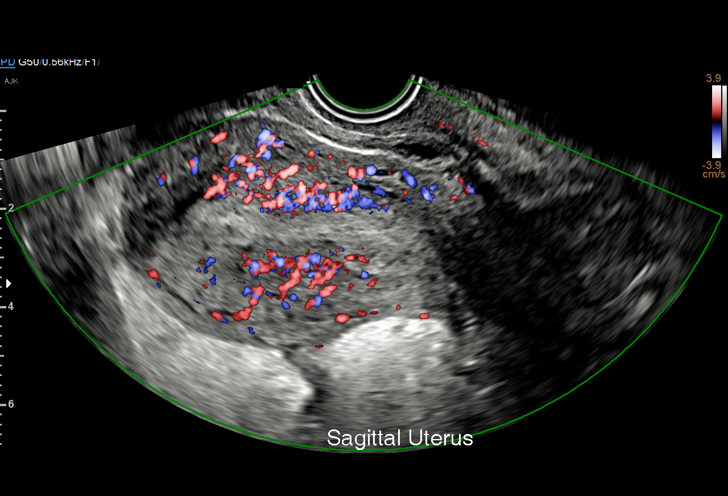
[im 41/80]
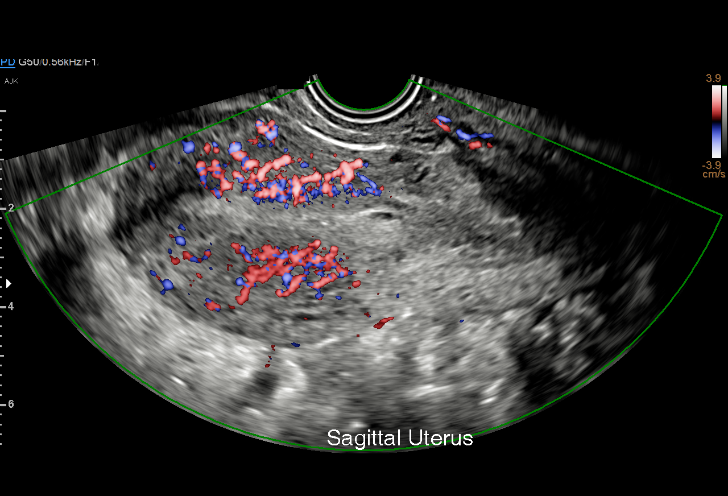
[im 44/80]
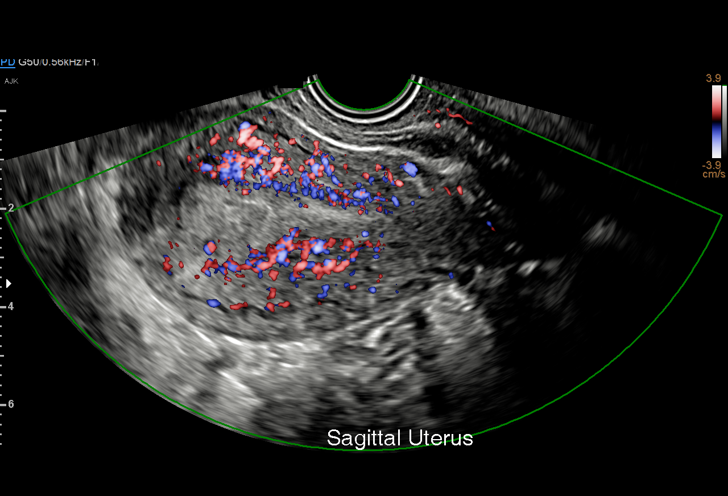
[im 50/80]
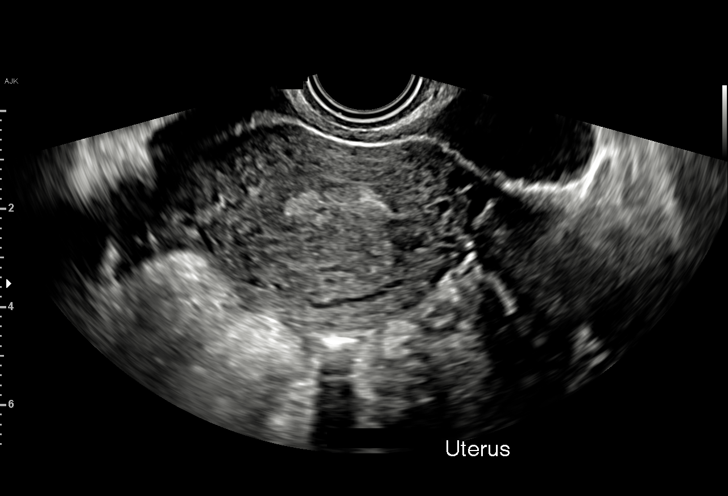
[im 56/80]
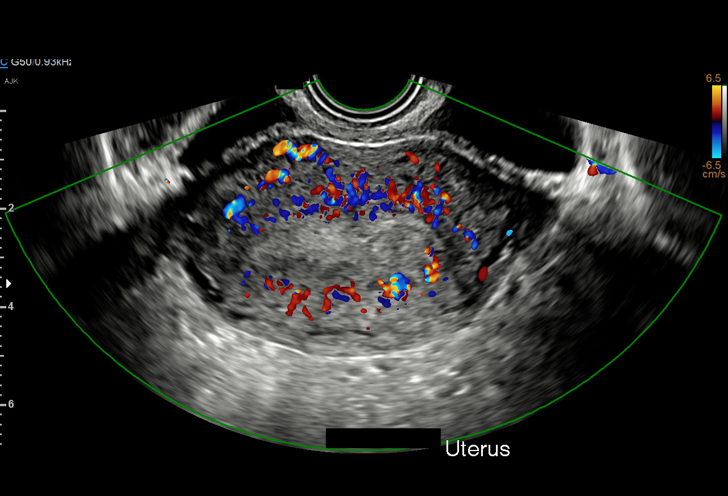
[im 62/80]
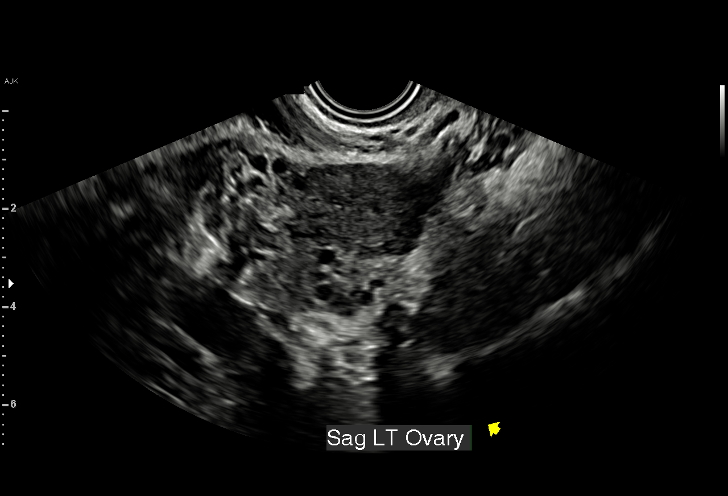
[im 68/80]
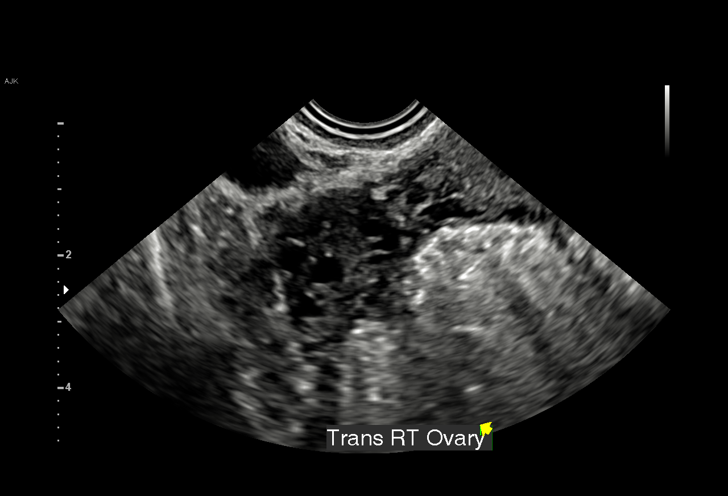
[im 74/80]
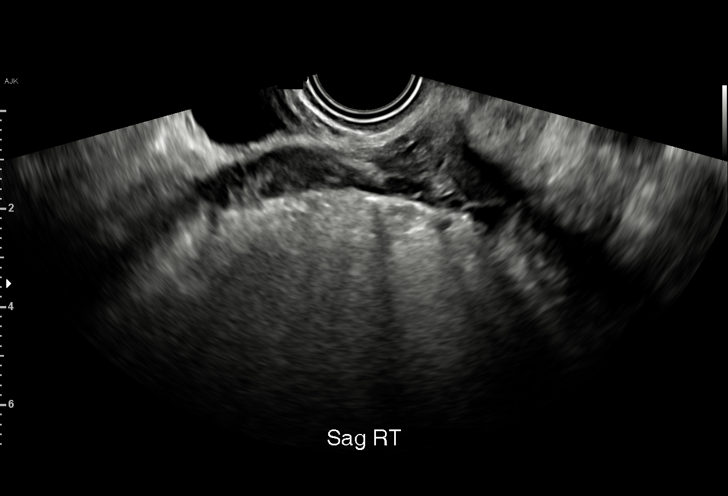
[im 80/80]
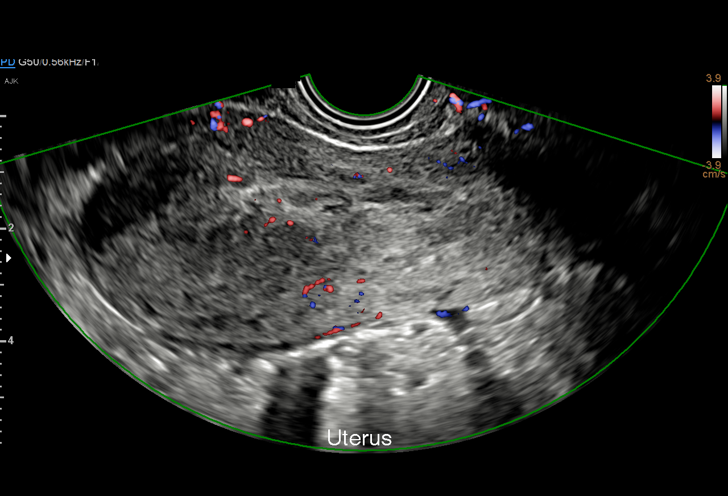

[15 of 28 positions shown; findings below may reference images not displayed]

FINDINGS: Intrauterine gestational sac: None.

Maternal uterus/adnexae: Heterogeneous and thickened endometrium
with increased vascularity. The ovaries are unremarkable.

Trace free fluid in the pelvis, likely physiologic.
IMPRESSION: 1. No intrauterine pregnancy. Heterogeneous and thickened
endometrium consistent with ongoing abortion.
